# Patient Record
Sex: Male | Born: 1995 | Race: Black or African American | Hispanic: No | Marital: Single | Smoking: Never smoker
Health system: Southern US, Community
[De-identification: ages and names within clinical notes are randomized; demographics above are authoritative.]

---

## 2017-06-24 ENCOUNTER — Emergency Department (HOSPITAL_COMMUNITY): Payer: Self-pay

## 2017-06-24 ENCOUNTER — Emergency Department (HOSPITAL_COMMUNITY)
Admission: EM | Admit: 2017-06-24 | Discharge: 2017-06-24 | Payer: Self-pay | Attending: Emergency Medicine | Admitting: Emergency Medicine

## 2017-06-24 ENCOUNTER — Encounter (HOSPITAL_COMMUNITY): Payer: Self-pay

## 2017-06-24 DIAGNOSIS — Y9389 Activity, other specified: Secondary | ICD-10-CM | POA: Insufficient documentation

## 2017-06-24 DIAGNOSIS — T148XXA Other injury of unspecified body region, initial encounter: Secondary | ICD-10-CM

## 2017-06-24 DIAGNOSIS — S51842A Puncture wound with foreign body of left forearm, initial encounter: Secondary | ICD-10-CM | POA: Insufficient documentation

## 2017-06-24 DIAGNOSIS — Y999 Unspecified external cause status: Secondary | ICD-10-CM | POA: Insufficient documentation

## 2017-06-24 DIAGNOSIS — Y929 Unspecified place or not applicable: Secondary | ICD-10-CM | POA: Insufficient documentation

## 2017-06-24 DIAGNOSIS — Z23 Encounter for immunization: Secondary | ICD-10-CM | POA: Insufficient documentation

## 2017-06-24 DIAGNOSIS — W540XXA Bitten by dog, initial encounter: Secondary | ICD-10-CM | POA: Insufficient documentation

## 2017-06-24 MED ORDER — AMOXICILLIN-POT CLAVULANATE 875-125 MG PO TABS
1.0000 | ORAL_TABLET | Freq: Once | ORAL | Status: AC
  Administered 2017-06-24: 1 via ORAL
  Filled 2017-06-24: qty 1

## 2017-06-24 MED ORDER — AMOXICILLIN-POT CLAVULANATE 500-125 MG PO TABS: 1.0000 | ORAL_TABLET | Freq: Three times a day (TID) | ORAL | 0 refills | Status: AC

## 2017-06-24 MED ORDER — TETANUS-DIPHTH-ACELL PERTUSSIS 5-2.5-18.5 LF-MCG/0.5 IM SUSP
0.5000 mL | Freq: Once | INTRAMUSCULAR | Status: AC
  Administered 2017-06-24: 0.5 mL via INTRAMUSCULAR
  Filled 2017-06-24: qty 0.5

## 2017-06-24 NOTE — ED Provider Notes (Signed)
I saw and evaluated the patient, reviewed the resident's note and I agree with the findings and plan. I was present and provided direct supervision for all procedures. Please see associated encounter note. Briefly, the patient is a 22 year old right-hand-dominant male here with right forearm wounds after running from police and being bit by police canine.  On exam, he has 2 superficial, non-gaping wounds from tooth bite.  Plain films showed no retained foreign body.  Tetanus updated.  Wound was thoroughly cleansed and will place on prophylactic antibiotics.   EKG Interpretation None         Shaune Pollack, MD 06/24/17 [phone]

## 2017-06-24 NOTE — ED Provider Notes (Signed)
[facility] EMERGENCY DEPARTMENT Provider Note   CSN: [id number] Arrival date & time: 06/24/17  0421     History   Chief Complaint Chief Complaint  Patient presents with  . Animal Bite    HPI Jose Rasmussen is a 22 y.o. male.  HPI  Patient is a 22 year old male who comes in today brought by police after suffering a dog bite.  Patient was attempting to flee the police when a canine unit bit him on the right medial forearm.  Patient suffered 2 puncture wounds.  Animal is up-to-date on all shots.  Patient was uncertain as to tetanus status, will update appropriately.   History reviewed. No pertinent past medical history.  There are no active problems to display for this patient.   History reviewed. No pertinent surgical history.      Home Medications    Prior to Admission medications   Medication Sig Start Date End Date Taking? Authorizing Provider  amoxicillin-clavulanate (AUGMENTIN) 500-125 MG tablet Take 1 tablet (500 mg total) by mouth every 8 (eight) hours for 7 days. 06/24/17 07/01/17  Caren Griffins, MD    Family History No family history on file.  Social History Social History   Tobacco Use  . Smoking status: Never Smoker  . Smokeless tobacco: Never Used  Substance Use Topics  . Alcohol use: Never    Frequency: Never  . Drug use: Yes    Types: Marijuana     Allergies   Patient has no known allergies.   Review of Systems Review of Systems  Neurological: Negative for weakness and numbness.  All other systems reviewed and are negative.    Physical Exam Updated Vital Signs BP (!) 106/55   Pulse 74   Temp 98.6 F (37 C) (Oral)   Resp 18   SpO2 100%   Physical Exam  Constitutional: He appears well-developed and well-nourished.  HENT:  Head: Normocephalic and atraumatic.  Eyes: Conjunctivae are normal.  Neck: Neck supple.  Cardiovascular: Normal rate and regular rhythm.  No murmur heard. Pulmonary/Chest: Effort  normal and breath sounds normal. No respiratory distress.  Abdominal: Soft. There is no tenderness.  Musculoskeletal: Normal range of motion. He exhibits no edema.  Neurological: He is alert. No sensory deficit. He exhibits normal muscle tone.  Skin: Skin is warm and dry.  Psychiatric: He has a normal mood and affect.  Nursing note and vitals reviewed.    ED Treatments / Results  Labs (all labs ordered are listed, but only abnormal results are displayed) Labs Reviewed - No data to display  EKG None  Radiology Dg Forearm Right  Result Date: 06/24/2017 CLINICAL DATA:  Dog bite EXAM: RIGHT FOREARM - 2 VIEW COMPARISON:  None. FINDINGS: Frontal and lateral views obtained. There are radiopaque foreign bodies in the superficial soft tissues volar and medial to the mid ulna. The largest of these radiopaque foreign bodies measures 1.0 x 0.6 cm. No fracture or dislocation. Joint spaces appear normal. No erosive change. No elbow joint effusion. IMPRESSION: Multiple radiopaque foreign bodies in the superficial soft tissues volar and medial to the mid ulna. No bony abnormality. No soft tissue air appreciable. Electronically Signed   By: Bretta Bang III M.D.   On: 06/24/2017 08:15    Procedures Procedures (including critical care time)  Medications Ordered in ED Medications  Tdap (BOOSTRIX) injection 0.5 mL (0.5 mLs Intramuscular Given 06/24/17 0845)  amoxicillin-clavulanate (AUGMENTIN) 875-125 MG per tablet 1 tablet (1 tablet Oral Given 06/24/17 0845)  Initial Impression / Assessment and Plan / ED Course  I have reviewed the triage vital signs and the nursing notes.  Pertinent labs & imaging results that were available during my care of the patient were reviewed by me and considered in my medical decision making (see chart for details).    X-ray reveals retained foreign body at the site of puncture wounds.  Both wounds irrigated thoroughly with copious amounts of normal saline under  high pressure via a syringe.  Patient's tetanus was updated.  Wounds were dressed appropriately.  Patient appropriate for discharge this time, will give prescription for Augmentin. Pt given appropriate f/u and return precautions. Pt voiced understanding and is agreeable to discharge at this time.     Final Clinical Impressions(s) / ED Diagnoses   Final diagnoses:  Animal bite    ED Discharge Orders        Ordered    amoxicillin-clavulanate (AUGMENTIN) 500-125 MG tablet  Every 8 hours     06/24/17 0835       Caren Griffins, MD 06/24/17 1610    Shaune Pollack, MD 06/24/17 1955

## 2017-06-24 NOTE — ED Triage Notes (Addendum)
Pt was running from police and bit by K 9 on r arm , last tetanus unknown, small 1/4 in lac to R arm

## 2019-06-27 IMAGING — DX DG FOREARM 2V*R*
4 series · 4 of 4 positions shown · non-contrast
Comparison: None.

CLINICAL DATA: Dog bite

EXAM:
RIGHT FOREARM - 2 VIEW

[forearm ap]
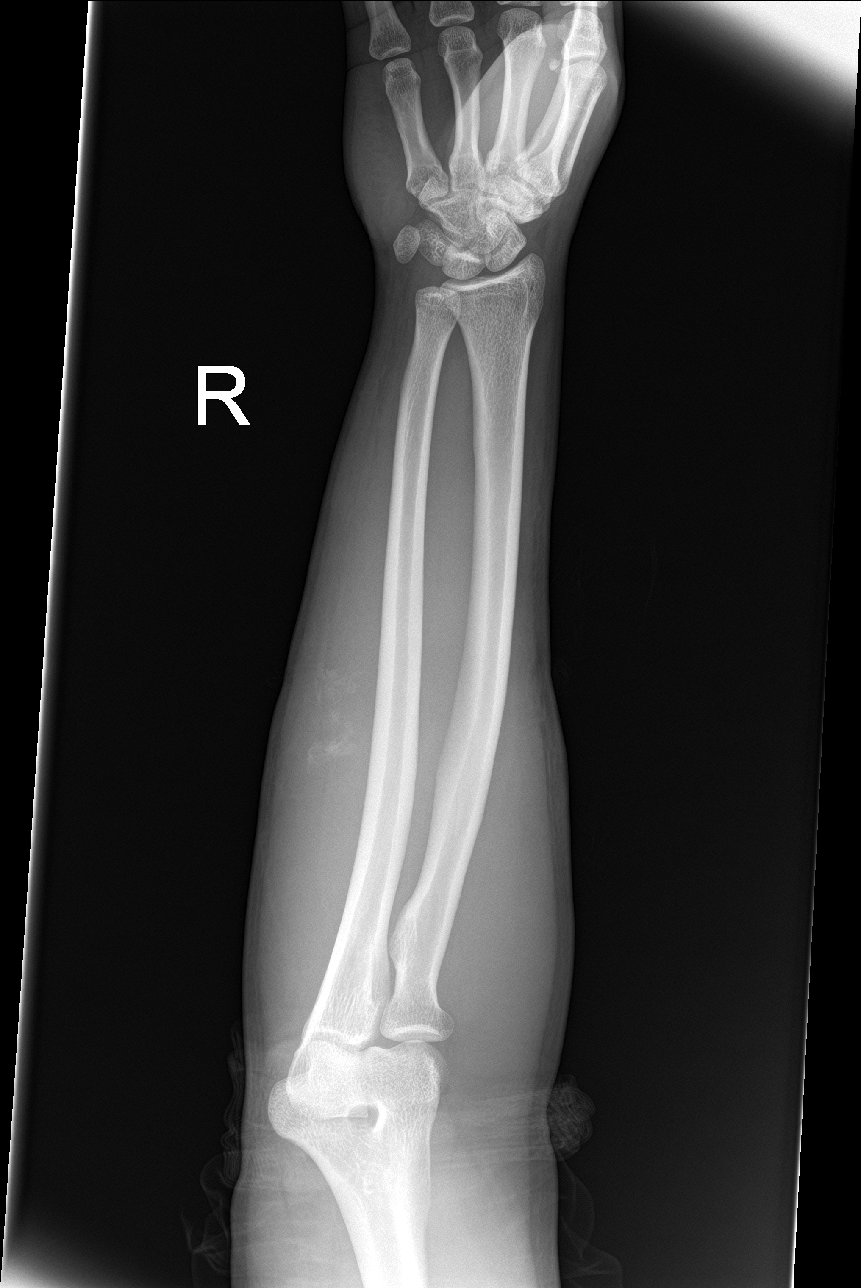

[forearm lat (1 of 3)]
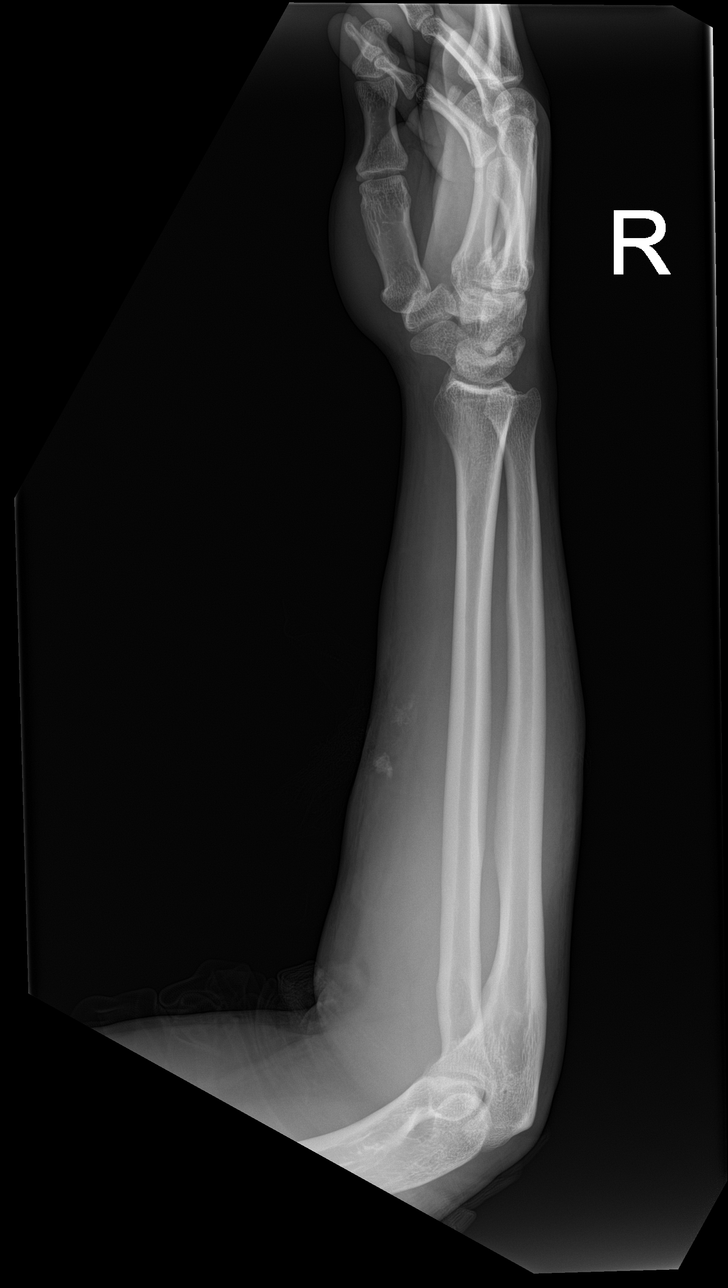

[forearm lat (2 of 3)]
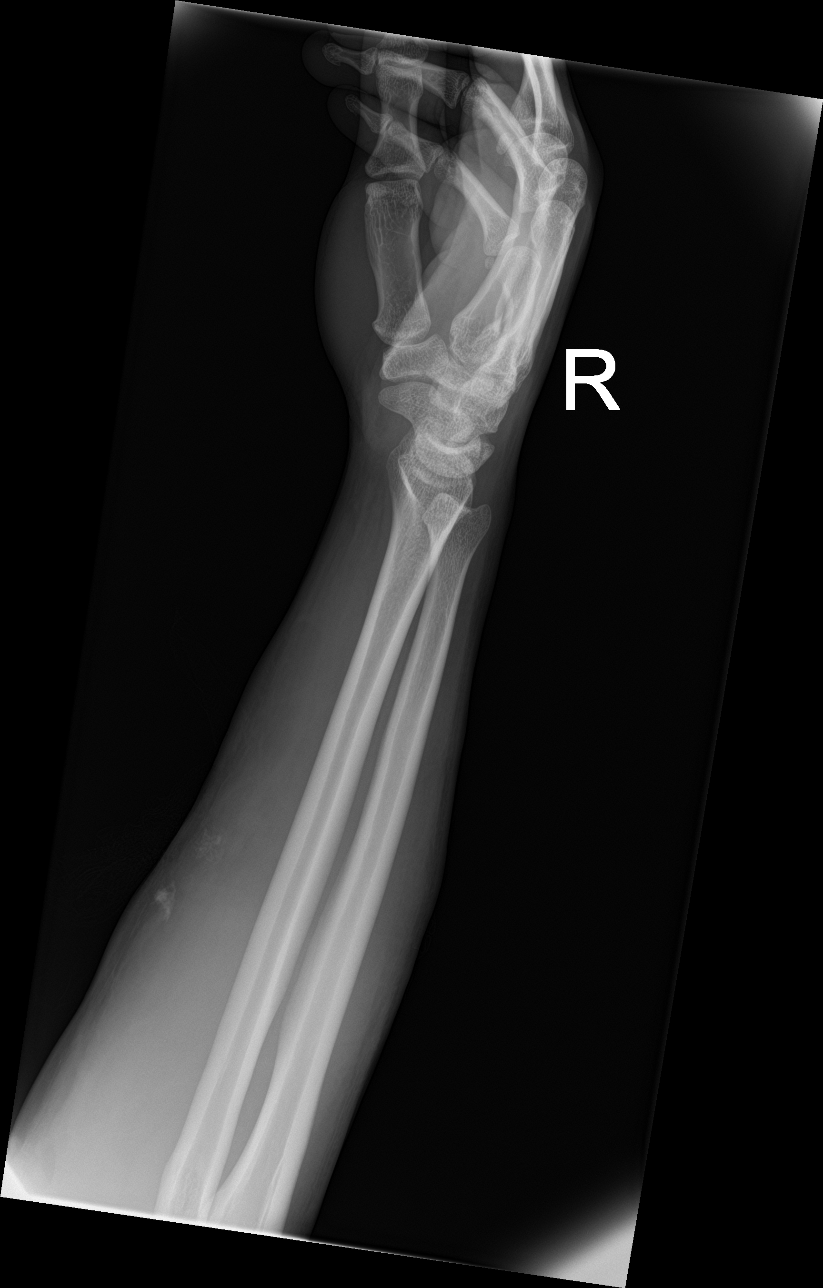

[forearm lat (3 of 3)]
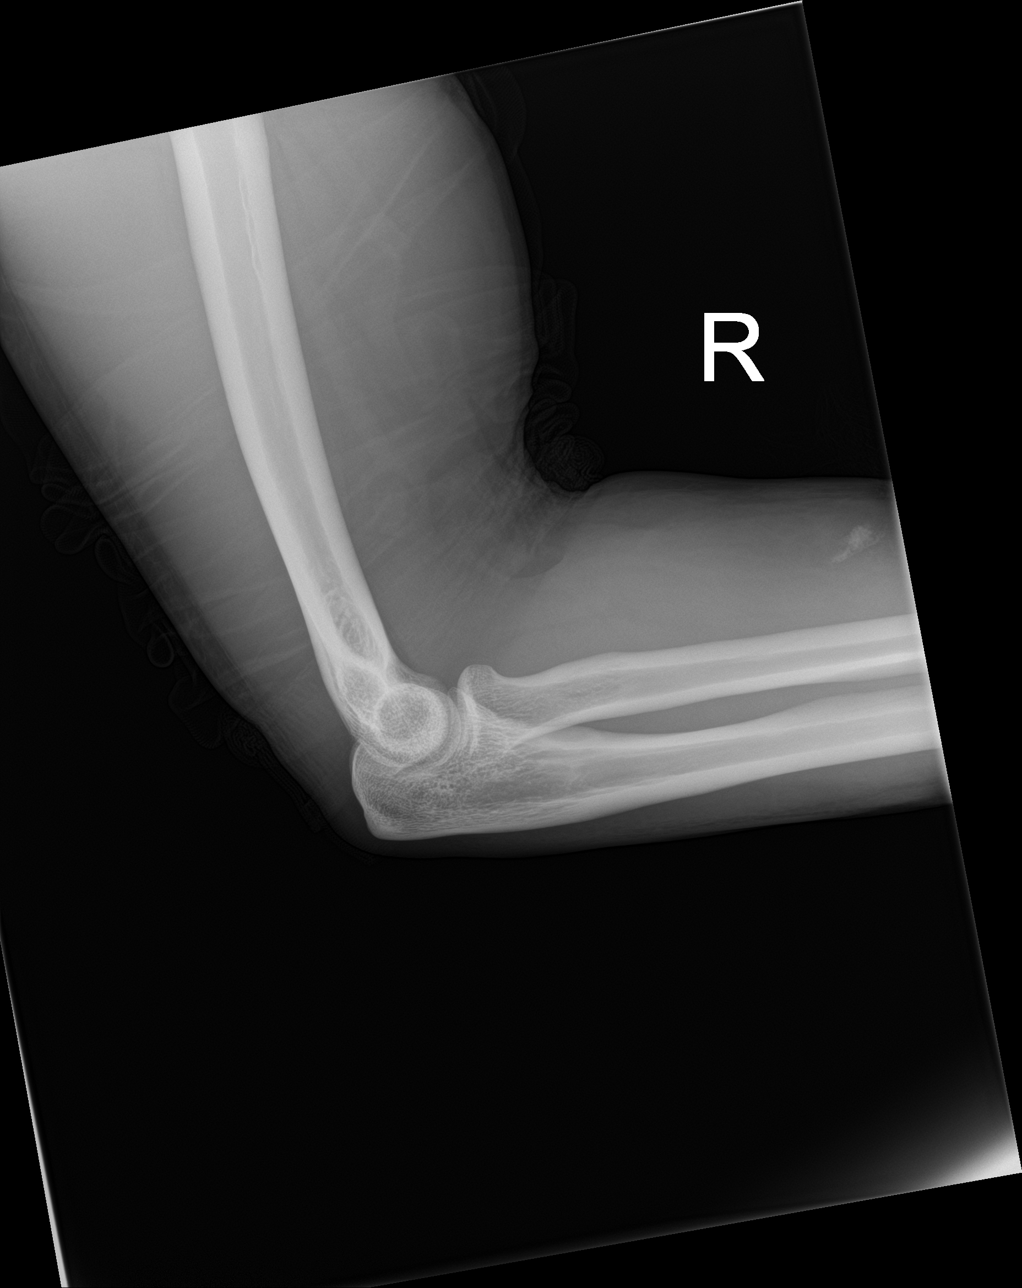

[4 of 4 positions shown; findings below may reference images not displayed]

FINDINGS: Frontal and lateral views obtained. There are radiopaque foreign
bodies in the superficial soft tissues volar and medial to the mid
ulna. The largest of these radiopaque foreign bodies measures 1.0 x
0.6 cm.

No fracture or dislocation. Joint spaces appear normal. No erosive
change. No elbow joint effusion.
IMPRESSION: Multiple radiopaque foreign bodies in the superficial soft tissues
volar and medial to the mid ulna. No bony abnormality. No soft
tissue air appreciable.
